# Patient Record
Sex: Female | Born: 1970 | Race: White | Hispanic: No | Marital: Married | Smoking: Never smoker
Health system: Southern US, Community
[De-identification: ages and names within clinical notes are randomized; demographics above are authoritative.]

## PROBLEM LIST (undated history)

## (undated) DIAGNOSIS — T4145XA Adverse effect of unspecified anesthetic, initial encounter: Secondary | ICD-10-CM

## (undated) DIAGNOSIS — M1990 Unspecified osteoarthritis, unspecified site: Secondary | ICD-10-CM

## (undated) DIAGNOSIS — T8859XA Other complications of anesthesia, initial encounter: Secondary | ICD-10-CM

## (undated) DIAGNOSIS — F419 Anxiety disorder, unspecified: Secondary | ICD-10-CM

## (undated) DIAGNOSIS — Z973 Presence of spectacles and contact lenses: Secondary | ICD-10-CM

## (undated) DIAGNOSIS — Z8489 Family history of other specified conditions: Secondary | ICD-10-CM

## (undated) DIAGNOSIS — Z8709 Personal history of other diseases of the respiratory system: Secondary | ICD-10-CM

## (undated) DIAGNOSIS — C801 Malignant (primary) neoplasm, unspecified: Secondary | ICD-10-CM

## (undated) HISTORY — PX: APPENDECTOMY: SHX54

## (undated) HISTORY — PX: DIAGNOSTIC LAPAROSCOPY: SUR761

## (undated) HISTORY — PX: OTHER SURGICAL HISTORY: SHX169

## (undated) HISTORY — PX: CHOLECYSTECTOMY: SHX55

## (undated) HISTORY — PX: ABDOMINAL HYSTERECTOMY: SHX81

---

## 2004-06-19 ENCOUNTER — Encounter: Admission: RE | Admit: 2004-06-19 | Discharge: 2004-06-19 | Payer: Self-pay | Admitting: Specialist

## 2004-06-26 ENCOUNTER — Encounter: Admission: RE | Admit: 2004-06-26 | Discharge: 2004-06-26 | Payer: Self-pay | Admitting: Orthopedic Surgery

## 2015-06-30 NOTE — H&P (Signed)
TOTAL HIP ADMISSION H&P  Patient is admitted for left total hip arthroplasty, anterior approach  (CERAMIC-ON-CERAMIC)  Subjective:  Chief Complaint:  Left hip primary OA / pain  HPI: Mary Padilla, 45 y.o. female, has a history of pain and functional disability in the left hip(s) due to arthritis and patient has failed non-surgical conservative treatments for greater than 12 weeks to include NSAID's and/or analgesics, corticosteriod injections and activity modification.  Onset of symptoms was gradual starting 2+ years ago with gradually worsening course since that time.The patient noted no past surgery on the left hip(s).  Patient currently rates pain in the left hip at 10 out of 10 with activity. Patient has night pain, worsening of pain with activity and weight bearing, trendelenberg gait, pain that interfers with activities of daily living and pain with passive range of motion. Patient has evidence of periarticular osteophytes and joint space narrowing by imaging studies. This condition presents safety issues increasing the risk of falls.  There is no current active infection.  Risks, benefits and expectations were discussed with the patient.  Risks including but not limited to the risk of anesthesia, blood clots, nerve damage, blood vessel damage, failure of the prosthesis, infection and up to and including death.  Patient understand the risks, benefits and expectations and wishes to proceed with surgery.   PCP: Mateo Flow, MD  D/C Plans:      Home with HHPT  Post-op Meds:       No Rx given  Tranexamic Acid:      To be given - IV   Decadron:      Is to be given  FYI:     ASA  Norco     Past Medical History  Diagnosis Date  . Complication of anesthesia     difficult to awaken   . Family history of adverse reaction to anesthesia     pts mother has allergy to sodium pentathol  . Wears glasses   . History of bronchitis   . Anxiety   . Arthritis   . Cancer [facility])     melanoma right  breast     Past Surgical History  Procedure Laterality Date  . Abdominal hysterectomy    . Melanoma of right breast    . Appendectomy    . Cholecystectomy    . Diagnostic laparoscopy      times 3  . Arthroscopy of right knee      times 3  . Arthroscopy of right shoulder       times 4    No prescriptions prior to admission   Allergies  Allergen Reactions  . Banana Anaphylaxis  . Ceclor [Cefaclor] Swelling  . Kiwi Extract Swelling and Other (See Comments)    Swelling on lips  . Latex Swelling and Other (See Comments)    Swelling of mouth, then throat and then goes down throat.    Social History  Substance Use Topics  . Smoking status: Never Smoker   . Smokeless tobacco: Never Used  . Alcohol Use: Yes     Comment: occas       Review of Systems  Constitutional: Negative.   HENT: Negative.   Eyes: Negative.   Respiratory: Negative.   Cardiovascular: Negative.   Gastrointestinal: Negative.   Genitourinary: Negative.   Musculoskeletal: Positive for joint pain.  Skin: Negative.   Neurological: Negative.   Endo/Heme/Allergies: Positive for environmental allergies.  Psychiatric/Behavioral: The patient is nervous/anxious.     Objective:  Physical Exam  Constitutional: She is oriented to person, place, and time. She appears well-developed.  HENT:  Head: Normocephalic.  Eyes: Pupils are equal, round, and reactive to light.  Neck: Neck supple. No JVD present. No tracheal deviation present. No thyromegaly present.  Cardiovascular: Normal rate, regular rhythm, normal heart sounds and intact distal pulses.   Respiratory: Effort normal and breath sounds normal. No stridor. No respiratory distress. She has no wheezes.  GI: Soft. There is no tenderness. There is no guarding.  Musculoskeletal:       Left hip: She exhibits decreased range of motion, decreased strength, tenderness and bony tenderness. She exhibits no swelling, no deformity and no laceration.  Lymphadenopathy:     She has no cervical adenopathy.  Neurological: She is alert and oriented to person, place, and time.  Skin: Skin is warm and dry.  Psychiatric: She has a normal mood and affect.      Imaging Review Plain radiographs demonstrate severe degenerative joint disease of the left hip(s). The bone quality appears to be good for age and reported activity level.  Assessment/Plan:  End stage arthritis, left hip(s)  The patient history, physical examination, clinical judgement of the provider and imaging studies are consistent with end stage degenerative joint disease of the left hip(s) and total hip arthroplasty is deemed medically necessary. The treatment options including medical management, injection therapy, arthroscopy and arthroplasty were discussed at length. The risks and benefits of total hip arthroplasty were presented and reviewed. The risks due to aseptic loosening, infection, stiffness, dislocation/subluxation,  thromboembolic complications and other imponderables were discussed.  The patient acknowledged the explanation, agreed to proceed with the plan and consent was signed. Patient is being admitted for inpatient treatment for surgery, pain control, PT, OT, prophylactic antibiotics, VTE prophylaxis, progressive ambulation and ADL's and discharge planning.The patient is planning to be discharged home with home health services.     West Pugh Leesha Veno   PA-C  07/07/2015, 8:15 AM

## 2015-07-05 ENCOUNTER — Encounter (HOSPITAL_COMMUNITY)
Admission: RE | Admit: 2015-07-05 | Discharge: 2015-07-05 | Disposition: A | Discharge status: Home / Self Care | Payer: BC Managed Care – PPO | Source: Ambulatory Visit | Attending: Orthopedic Surgery | Admitting: Orthopedic Surgery

## 2015-07-05 ENCOUNTER — Encounter (HOSPITAL_COMMUNITY): Payer: Self-pay

## 2015-07-05 DIAGNOSIS — M1612 Unilateral primary osteoarthritis, left hip: Secondary | ICD-10-CM | POA: Diagnosis not present

## 2015-07-05 DIAGNOSIS — M25552 Pain in left hip: Secondary | ICD-10-CM | POA: Insufficient documentation

## 2015-07-05 DIAGNOSIS — Z01812 Encounter for preprocedural laboratory examination: Secondary | ICD-10-CM | POA: Diagnosis present

## 2015-07-05 HISTORY — DX: Family history of other specified conditions: Z84.89

## 2015-07-05 HISTORY — DX: Unspecified osteoarthritis, unspecified site: M19.90

## 2015-07-05 HISTORY — DX: Other complications of anesthesia, initial encounter: T88.59XA

## 2015-07-05 HISTORY — DX: Adverse effect of unspecified anesthetic, initial encounter: T41.45XA

## 2015-07-05 HISTORY — DX: Personal history of other diseases of the respiratory system: Z87.09

## 2015-07-05 HISTORY — DX: Anxiety disorder, unspecified: F41.9

## 2015-07-05 HISTORY — DX: Presence of spectacles and contact lenses: Z97.3

## 2015-07-05 HISTORY — DX: Malignant (primary) neoplasm, unspecified: C80.1

## 2015-07-05 LAB — CBC
HCT: 40 % (ref 36.0–46.0)
HEMOGLOBIN: 13.3 g/dL (ref 12.0–15.0)
MCH: 30.1 pg (ref 26.0–34.0)
MCHC: 33.3 g/dL (ref 30.0–36.0)
MCV: 90.5 fL (ref 78.0–100.0)
Platelets: 261 10*3/uL (ref 150–400)
RBC: 4.42 MIL/uL (ref 3.87–5.11)
RDW: 12.8 % (ref 11.5–15.5)
WBC: 7.9 10*3/uL (ref 4.0–10.5)

## 2015-07-05 LAB — SURGICAL PCR SCREEN
MRSA, PCR: NEGATIVE
Staphylococcus aureus: NEGATIVE

## 2015-07-05 LAB — ABO/RH: ABO/RH(D): A POS

## 2015-07-05 NOTE — Progress Notes (Signed)
Clearance note per chart per Dr Humphrey Rolls 05/05/2015

## 2015-07-05 NOTE — Patient Instructions (Signed)
Mary Padilla  07/05/2015   Your procedure is scheduled on: Tuesday July 12, 2015   Report to [facility]  Entrance take [facility]  elevators to 3rd floor to  [place] at 6:00 AM.  Call this number if you have problems the morning of surgery [phone]   Remember: ONLY 1 PERSON MAY GO WITH YOU TO SHORT STAY TO GET  READY MORNING OF [place].  Do not eat food or drink liquids :After Midnight.     Take these medicines the morning of surgery with A SIP OF WATER: Venlafaxine (Effexor)                               You may not have any metal on your body including hair pins and              piercings  Do not wear jewelry, make-up, lotions, powders or perfumes, deodorant             Do not wear nail polish.  Do not shave  48 hours prior to surgery.                Do not bring valuables to the hospital. [place].  Contacts, dentures or bridgework may not be worn into surgery.  Leave suitcase in the car. After surgery it may be brought to your room.                Please read over the following fact sheets you were given: MRSA INFORMATION SHEET; INCENTIVE SPIROMETER; BLOOD TRANSFUSION INFORMATION SHEET  _____________________________________________________________________             [facility] - Preparing for Surgery Before surgery, you can play an important role.  Because skin is not sterile, your skin needs to be as free of germs as possible.  You can reduce the number of germs on your skin by washing with CHG (chlorahexidine gluconate) soap before surgery.  CHG is an antiseptic cleaner which kills germs and bonds with the skin to continue killing germs even after washing. Please DO NOT use if you have an allergy to CHG or antibacterial soaps.  If your skin becomes reddened/irritated stop using the CHG and inform your nurse when you arrive at Short Stay. Do not shave (including legs and underarms) for  at least 48 hours prior to the first CHG shower.  You may shave your face/neck. Please follow these instructions carefully:  1.  Shower with CHG Soap the night before surgery and the  morning of Surgery.  2.  If you choose to wash your hair, wash your hair first as usual with your  normal  shampoo.  3.  After you shampoo, rinse your hair and body thoroughly to remove the  shampoo.                           4.  Use CHG as you would any other liquid soap.  You can apply chg directly  to the skin and wash                       Gently with a scrungie or clean washcloth.  5.  Apply the CHG Soap to your body ONLY FROM THE NECK DOWN.   Do not use on face/ open                           Wound or open sores. Avoid contact with eyes, ears mouth and genitals (private parts).                       Wash face,  Genitals (private parts) with your normal soap.             6.  Wash thoroughly, paying special attention to the area where your surgery  will be performed.  7.  Thoroughly rinse your body with warm water from the neck down.  8.  DO NOT shower/wash with your normal soap after using and rinsing off  the CHG Soap.                9.  Pat yourself dry with a clean towel.            10.  Wear clean pajamas.            11.  Place clean sheets on your bed the night of your first shower and do not  sleep with pets. Day of Surgery : Do not apply any lotions/deodorants the morning of surgery.  Please wear clean clothes to the hospital/surgery center.  FAILURE TO FOLLOW THESE INSTRUCTIONS MAY RESULT IN THE CANCELLATION OF YOUR SURGERY PATIENT SIGNATURE_________________________________  NURSE SIGNATURE__________________________________  ________________________________________________________________________   Adam Phenix  An incentive spirometer is a tool that can help keep your lungs clear and active. This tool measures how well you are filling your lungs with each breath. Taking long deep  breaths may help reverse or decrease the chance of developing breathing (pulmonary) problems (especially infection) following:  A long period of time when you are unable to move or be active. BEFORE THE PROCEDURE   If the spirometer includes an indicator to show your best effort, your nurse or respiratory therapist will set it to a desired goal.  If possible, sit up straight or lean slightly forward. Try not to slouch.  Hold the incentive spirometer in an upright position. INSTRUCTIONS FOR USE   Sit on the edge of your bed if possible, or sit up as far as you can in bed or on a chair.  Hold the incentive spirometer in an upright position.  Breathe out normally.  Place the mouthpiece in your mouth and seal your lips tightly around it.  Breathe in slowly and as deeply as possible, raising the piston or the ball toward the top of the column.  Hold your breath for 3-5 seconds or for as long as possible. Allow the piston or ball to fall to the bottom of the column.  Remove the mouthpiece from your mouth and breathe out normally.  Rest for a few seconds and repeat Steps 1 through 7 at least 10 times every 1-2 hours when you are awake. Take your time and take a few normal breaths between deep breaths.  The spirometer may include an indicator to show your best effort. Use the indicator as a goal to work toward during each repetition.  After each set of 10 deep breaths, practice coughing to be sure your lungs are clear. If you have an incision (the cut made at the time of surgery), support your incision when coughing by placing a pillow or  rolled up towels firmly against it. Once you are able to get out of bed, walk around indoors and cough well. You may stop using the incentive spirometer when instructed by your caregiver.  RISKS AND COMPLICATIONS  Take your time so you do not get dizzy or light-headed.  If you are in pain, you may need to take or ask for pain medication before doing  incentive spirometry. It is harder to take a deep breath if you are having pain. AFTER USE  Rest and breathe slowly and easily.  It can be helpful to keep track of a log of your progress. Your caregiver can provide you with a simple table to help with this. If you are using the spirometer at home, follow these instructions: [place] IF:   You are having difficultly using the spirometer.  You have trouble using the spirometer as often as instructed.  Your pain medication is not giving enough relief while using the spirometer.  You develop fever of 100.5 F (38.1 C) or higher. SEEK IMMEDIATE MEDICAL CARE IF:   You cough up bloody sputum that had not been present before.  You develop fever of 102 F (38.9 C) or greater.  You develop worsening pain at or near the incision site. MAKE SURE YOU:   Understand these instructions.  Will watch your condition.  Will get help right away if you are not doing well or get worse. Document Released: 06/04/2006 Document Revised: 04/16/2011 Document Reviewed: 08/05/2006 ExitCare Patient Information 2014 Exit[employer], [place].   ________________________________________________________________________  WHAT IS A BLOOD TRANSFUSION? Blood Transfusion Information  A transfusion is the replacement of blood or some of its parts. Blood is made up of multiple cells which provide different functions.  Red blood cells carry oxygen and are used for blood loss replacement.  White blood cells fight against infection.  Platelets control bleeding.  Plasma helps clot blood.  Other blood products are available for specialized needs, such as hemophilia or other clotting disorders. BEFORE THE TRANSFUSION  Who gives blood for transfusions?   Healthy volunteers who are fully evaluated to make sure their blood is safe. This is blood bank blood. Transfusion therapy is the safest it has ever been in the practice of medicine. Before blood is taken from a  donor, a complete history is taken to make sure that person has no history of diseases nor engages in risky social behavior (examples are intravenous drug use or sexual activity with multiple partners). The donor's travel history is screened to minimize risk of transmitting infections, such as malaria. The donated blood is tested for signs of infectious diseases, such as HIV and hepatitis. The blood is then tested to be sure it is compatible with you in order to minimize the chance of a transfusion reaction. If you or a relative donates blood, this is often done in anticipation of surgery and is not appropriate for emergency situations. It takes many days to process the donated blood. RISKS AND COMPLICATIONS Although transfusion therapy is very safe and saves many lives, the main dangers of transfusion include:   Getting an infectious disease.  Developing a transfusion reaction. This is an allergic reaction to something in the blood you were given. Every precaution is taken to prevent this. The decision to have a blood transfusion has been considered carefully by your caregiver before blood is given. Blood is not given unless the benefits outweigh the risks. AFTER THE TRANSFUSION  Right after receiving a blood transfusion, you will usually  feel much better and more energetic. This is especially true if your red blood cells have gotten low (anemic). The transfusion raises the level of the red blood cells which carry oxygen, and this usually causes an energy increase.  The nurse administering the transfusion will monitor you carefully for complications. HOME CARE INSTRUCTIONS  No special instructions are needed after a transfusion. You may find your energy is better. Speak with your caregiver about any limitations on activity for underlying diseases you may have. SEEK MEDICAL CARE IF:   Your condition is not improving after your transfusion.  You develop redness or irritation at the intravenous (IV)  site. SEEK IMMEDIATE MEDICAL CARE IF:  Any of the following symptoms occur over the next 12 hours:  Shaking chills.  You have a temperature by mouth above 102 F (38.9 C), not controlled by medicine.  Chest, back, or muscle pain.  People around you feel you are not acting correctly or are confused.  Shortness of breath or difficulty breathing.  Dizziness and fainting.  You get a rash or develop hives.  You have a decrease in urine output.  Your urine turns a dark color or changes to pink, red, or brown. Any of the following symptoms occur over the next 10 days:  You have a temperature by mouth above 102 F (38.9 C), not controlled by medicine.  Shortness of breath.  Weakness after normal activity.  The white part of the eye turns yellow (jaundice).  You have a decrease in the amount of urine or are urinating less often.  Your urine turns a dark color or changes to pink, red, or brown. Document Released: 01/20/2000 Document Revised: 04/16/2011 Document Reviewed: 09/08/2007 [facility] Patient Information 2014 [place], [place].  _______________________________________________________________________

## 2015-07-12 ENCOUNTER — Inpatient Hospital Stay (HOSPITAL_COMMUNITY): Payer: BC Managed Care – PPO

## 2015-07-12 ENCOUNTER — Encounter (HOSPITAL_COMMUNITY): Payer: Self-pay | Admitting: *Deleted

## 2015-07-12 ENCOUNTER — Inpatient Hospital Stay (HOSPITAL_COMMUNITY): Payer: BC Managed Care – PPO | Admitting: Certified Registered"

## 2015-07-12 ENCOUNTER — Inpatient Hospital Stay (HOSPITAL_COMMUNITY)
Admission: RE | Admit: 2015-07-12 | Discharge: 2015-07-13 | DRG: 470 | Disposition: A | Discharge status: Home Health | Payer: BC Managed Care – PPO | Source: Ambulatory Visit | Attending: Orthopedic Surgery | Admitting: Orthopedic Surgery

## 2015-07-12 ENCOUNTER — Encounter (HOSPITAL_COMMUNITY)
Admission: RE | Disposition: A | Discharge status: Home Health | Payer: Self-pay | Source: Ambulatory Visit | Attending: Orthopedic Surgery

## 2015-07-12 DIAGNOSIS — M1612 Unilateral primary osteoarthritis, left hip: Principal | ICD-10-CM | POA: Diagnosis present

## 2015-07-12 DIAGNOSIS — Z91040 Latex allergy status: Secondary | ICD-10-CM | POA: Diagnosis not present

## 2015-07-12 DIAGNOSIS — F419 Anxiety disorder, unspecified: Secondary | ICD-10-CM | POA: Diagnosis present

## 2015-07-12 DIAGNOSIS — Z6839 Body mass index (BMI) 39.0-39.9, adult: Secondary | ICD-10-CM | POA: Diagnosis not present

## 2015-07-12 DIAGNOSIS — M25552 Pain in left hip: Secondary | ICD-10-CM | POA: Diagnosis present

## 2015-07-12 DIAGNOSIS — E669 Obesity, unspecified: Secondary | ICD-10-CM | POA: Diagnosis present

## 2015-07-12 DIAGNOSIS — Z91018 Allergy to other foods: Secondary | ICD-10-CM

## 2015-07-12 DIAGNOSIS — Z881 Allergy status to other antibiotic agents status: Secondary | ICD-10-CM

## 2015-07-12 DIAGNOSIS — Z85820 Personal history of malignant melanoma of skin: Secondary | ICD-10-CM

## 2015-07-12 DIAGNOSIS — Z96649 Presence of unspecified artificial hip joint: Secondary | ICD-10-CM

## 2015-07-12 HISTORY — PX: TOTAL HIP ARTHROPLASTY: SHX124

## 2015-07-12 LAB — TYPE AND SCREEN
ABO/RH(D): A POS
Antibody Screen: NEGATIVE

## 2015-07-12 SURGERY — ARTHROPLASTY, HIP, TOTAL, ANTERIOR APPROACH
Anesthesia: Spinal | Site: Hip | Laterality: Left

## 2015-07-12 MED ORDER — PHENOL 1.4 % MT LIQD
1.0000 | OROMUCOSAL | Status: DC | PRN
  Filled 2015-07-12: qty 177

## 2015-07-12 MED ORDER — ALUM & MAG HYDROXIDE-SIMETH 200-200-20 MG/5ML PO SUSP: 30.0000 mL | ORAL | Status: DC | PRN

## 2015-07-12 MED ORDER — MIDAZOLAM HCL 5 MG/5ML IJ SOLN
INTRAMUSCULAR | Status: DC | PRN
  Administered 2015-07-12: 2 mg via INTRAVENOUS

## 2015-07-12 MED ORDER — DEXAMETHASONE SODIUM PHOSPHATE 10 MG/ML IJ SOLN
10.0000 mg | Freq: Once | INTRAMUSCULAR | Status: AC
  Administered 2015-07-13: 10 mg via INTRAVENOUS
  Filled 2015-07-12: qty 1

## 2015-07-12 MED ORDER — PROPOFOL 10 MG/ML IV BOLUS
INTRAVENOUS | Status: AC
  Filled 2015-07-12: qty 20

## 2015-07-12 MED ORDER — METOCLOPRAMIDE HCL 5 MG PO TABS: 5.0000 mg | ORAL_TABLET | Freq: Three times a day (TID) | ORAL | Status: DC | PRN

## 2015-07-12 MED ORDER — DEXAMETHASONE SODIUM PHOSPHATE 10 MG/ML IJ SOLN
INTRAMUSCULAR | Status: AC
  Filled 2015-07-12: qty 1

## 2015-07-12 MED ORDER — DIPHENHYDRAMINE HCL 25 MG PO CAPS: 25.0000 mg | ORAL_CAPSULE | Freq: Four times a day (QID) | ORAL | Status: DC | PRN

## 2015-07-12 MED ORDER — LIDOCAINE 2% (20 MG/ML) 5 ML SYRINGE
INTRAMUSCULAR | Status: DC | PRN
  Administered 2015-07-12: 20 mg via INTRAVENOUS

## 2015-07-12 MED ORDER — FERROUS SULFATE 325 (65 FE) MG PO TABS
325.0000 mg | ORAL_TABLET | Freq: Three times a day (TID) | ORAL | Status: DC
  Administered 2015-07-12 – 2015-07-13 (×3): 325 mg via ORAL
  Filled 2015-07-12 (×3): qty 1

## 2015-07-12 MED ORDER — METHOCARBAMOL 500 MG PO TABS
500.0000 mg | ORAL_TABLET | Freq: Four times a day (QID) | ORAL | Status: DC | PRN
  Administered 2015-07-12 – 2015-07-13 (×3): 500 mg via ORAL
  Filled 2015-07-12 (×3): qty 1

## 2015-07-12 MED ORDER — ESTROGENS CONJUGATED 0.9 MG PO TABS
0.9000 mg | ORAL_TABLET | Freq: Every day | ORAL | Status: DC
  Administered 2015-07-12 – 2015-07-13 (×2): 0.9 mg via ORAL
  Filled 2015-07-12 (×2): qty 1

## 2015-07-12 MED ORDER — METOCLOPRAMIDE HCL 5 MG/ML IJ SOLN: 5.0000 mg | Freq: Three times a day (TID) | INTRAMUSCULAR | Status: DC | PRN

## 2015-07-12 MED ORDER — ASPIRIN EC 325 MG PO TBEC
325.0000 mg | DELAYED_RELEASE_TABLET | Freq: Two times a day (BID) | ORAL | Status: DC
  Administered 2015-07-13: 325 mg via ORAL
  Filled 2015-07-12: qty 1

## 2015-07-12 MED ORDER — HYDROMORPHONE HCL 1 MG/ML IJ SOLN
0.2500 mg | INTRAMUSCULAR | Status: DC | PRN
  Administered 2015-07-12 (×4): 0.25 mg via INTRAVENOUS

## 2015-07-12 MED ORDER — DEXAMETHASONE SODIUM PHOSPHATE 10 MG/ML IJ SOLN
10.0000 mg | Freq: Once | INTRAMUSCULAR | Status: AC
  Administered 2015-07-12: 10 mg via INTRAVENOUS

## 2015-07-12 MED ORDER — MENTHOL 3 MG MT LOZG: 1.0000 | LOZENGE | OROMUCOSAL | Status: DC | PRN

## 2015-07-12 MED ORDER — PROMETHAZINE HCL 25 MG/ML IJ SOLN: 6.2500 mg | INTRAMUSCULAR | Status: DC | PRN

## 2015-07-12 MED ORDER — FENTANYL CITRATE (PF) 100 MCG/2ML IJ SOLN
INTRAMUSCULAR | Status: DC | PRN
  Administered 2015-07-12 (×2): 50 ug via INTRAVENOUS

## 2015-07-12 MED ORDER — VANCOMYCIN HCL IN DEXTROSE 1-5 GM/200ML-% IV SOLN
1000.0000 mg | Freq: Two times a day (BID) | INTRAVENOUS | Status: AC
  Administered 2015-07-12: 1000 mg via INTRAVENOUS
  Filled 2015-07-12: qty 200

## 2015-07-12 MED ORDER — ONDANSETRON HCL 4 MG/2ML IJ SOLN
INTRAMUSCULAR | Status: AC
  Filled 2015-07-12: qty 2

## 2015-07-12 MED ORDER — BUPIVACAINE HCL (PF) 0.5 % IJ SOLN
INTRAMUSCULAR | Status: AC
  Filled 2015-07-12: qty 30

## 2015-07-12 MED ORDER — PROPOFOL 10 MG/ML IV BOLUS
INTRAVENOUS | Status: DC | PRN
  Administered 2015-07-12: 20 mg via INTRAVENOUS

## 2015-07-12 MED ORDER — TRANEXAMIC ACID 1000 MG/10ML IV SOLN
1000.0000 mg | Freq: Once | INTRAVENOUS | Status: AC
  Administered 2015-07-12: 1000 mg via INTRAVENOUS
  Filled 2015-07-12: qty 10

## 2015-07-12 MED ORDER — MAGNESIUM CITRATE PO SOLN: 1.0000 | Freq: Once | ORAL | Status: DC | PRN

## 2015-07-12 MED ORDER — PROPOFOL 10 MG/ML IV BOLUS
INTRAVENOUS | Status: AC
  Filled 2015-07-12: qty 40

## 2015-07-12 MED ORDER — ONDANSETRON HCL 4 MG/2ML IJ SOLN
INTRAMUSCULAR | Status: DC | PRN
  Administered 2015-07-12: 4 mg via INTRAVENOUS

## 2015-07-12 MED ORDER — HYDROCODONE-ACETAMINOPHEN 7.5-325 MG PO TABS
1.0000 | ORAL_TABLET | ORAL | Status: DC
  Administered 2015-07-12: 2 via ORAL
  Administered 2015-07-12: 1 via ORAL
  Administered 2015-07-12: 2 via ORAL
  Administered 2015-07-12: 1 via ORAL
  Administered 2015-07-13 (×3): 2 via ORAL
  Filled 2015-07-12 (×7): qty 2

## 2015-07-12 MED ORDER — HYDROMORPHONE HCL 1 MG/ML IJ SOLN
INTRAMUSCULAR | Status: AC
  Filled 2015-07-12: qty 1

## 2015-07-12 MED ORDER — HYDROMORPHONE HCL 1 MG/ML IJ SOLN
0.5000 mg | INTRAMUSCULAR | Status: DC | PRN
  Administered 2015-07-12 – 2015-07-13 (×3): 1 mg via INTRAVENOUS
  Filled 2015-07-12 (×3): qty 1

## 2015-07-12 MED ORDER — MIDAZOLAM HCL 2 MG/2ML IJ SOLN
INTRAMUSCULAR | Status: AC
  Filled 2015-07-12: qty 2

## 2015-07-12 MED ORDER — SODIUM CHLORIDE 0.9 % IV SOLN
100.0000 mL/h | INTRAVENOUS | Status: DC
  Administered 2015-07-12 – 2015-07-13 (×2): 100 mL/h via INTRAVENOUS
  Filled 2015-07-12 (×4): qty 1000

## 2015-07-12 MED ORDER — DOCUSATE SODIUM 100 MG PO CAPS
100.0000 mg | ORAL_CAPSULE | Freq: Two times a day (BID) | ORAL | Status: DC
  Administered 2015-07-12 – 2015-07-13 (×2): 100 mg via ORAL
  Filled 2015-07-12 (×2): qty 1

## 2015-07-12 MED ORDER — BISACODYL 10 MG RE SUPP: 10.0000 mg | Freq: Every day | RECTAL | Status: DC | PRN

## 2015-07-12 MED ORDER — PROPOFOL 500 MG/50ML IV EMUL
INTRAVENOUS | Status: DC | PRN
  Administered 2015-07-12: 50 ug/kg/min via INTRAVENOUS

## 2015-07-12 MED ORDER — ONDANSETRON HCL 4 MG/2ML IJ SOLN: 4.0000 mg | Freq: Four times a day (QID) | INTRAMUSCULAR | Status: DC | PRN

## 2015-07-12 MED ORDER — LACTATED RINGERS IV SOLN
INTRAVENOUS | Status: DC
Start: 2015-07-12 — End: 2015-07-13
  Administered 2015-07-12 (×2): via INTRAVENOUS

## 2015-07-12 MED ORDER — CEFAZOLIN SODIUM-DEXTROSE 2-4 GM/100ML-% IV SOLN: 2.0000 g | INTRAVENOUS | Status: DC

## 2015-07-12 MED ORDER — BUPIVACAINE IN DEXTROSE 0.75-8.25 % IT SOLN
INTRATHECAL | Status: DC | PRN
  Administered 2015-07-12: 1.8 mL via INTRATHECAL

## 2015-07-12 MED ORDER — SODIUM CHLORIDE 0.9 % IR SOLN
Status: DC | PRN
  Administered 2015-07-12: 1000 mL

## 2015-07-12 MED ORDER — CHLORHEXIDINE GLUCONATE 4 % EX LIQD: 60.0000 mL | Freq: Once | CUTANEOUS | Status: DC

## 2015-07-12 MED ORDER — FENTANYL CITRATE (PF) 100 MCG/2ML IJ SOLN
INTRAMUSCULAR | Status: AC
  Filled 2015-07-12: qty 2

## 2015-07-12 MED ORDER — DEXTROSE 5 % IV SOLN
500.0000 mg | Freq: Four times a day (QID) | INTRAVENOUS | Status: DC | PRN
  Administered 2015-07-12: 500 mg via INTRAVENOUS
  Filled 2015-07-12: qty 5
  Filled 2015-07-12: qty 550

## 2015-07-12 MED ORDER — ONDANSETRON HCL 4 MG PO TABS: 4.0000 mg | ORAL_TABLET | Freq: Four times a day (QID) | ORAL | Status: DC | PRN

## 2015-07-12 MED ORDER — VENLAFAXINE HCL ER 150 MG PO CP24
150.0000 mg | ORAL_CAPSULE | Freq: Every morning | ORAL | Status: DC
  Administered 2015-07-13: 150 mg via ORAL
  Filled 2015-07-12: qty 1

## 2015-07-12 MED ORDER — POLYETHYLENE GLYCOL 3350 17 G PO PACK
17.0000 g | PACK | Freq: Two times a day (BID) | ORAL | Status: DC
Start: 2015-07-12 — End: 2015-07-13
  Administered 2015-07-12 – 2015-07-13 (×2): 17 g via ORAL
  Filled 2015-07-12 (×2): qty 1

## 2015-07-12 MED ORDER — CELECOXIB 200 MG PO CAPS
200.0000 mg | ORAL_CAPSULE | Freq: Two times a day (BID) | ORAL | Status: DC
Start: 2015-07-12 — End: 2015-07-13
  Administered 2015-07-12 – 2015-07-13 (×2): 200 mg via ORAL
  Filled 2015-07-12 (×2): qty 1

## 2015-07-12 MED ORDER — LIDOCAINE HCL (CARDIAC) 20 MG/ML IV SOLN
INTRAVENOUS | Status: AC
  Filled 2015-07-12: qty 5

## 2015-07-12 MED ORDER — VANCOMYCIN HCL 10 G IV SOLR
1500.0000 mg | Freq: Once | INTRAVENOUS | Status: AC
  Administered 2015-07-12: 1500 mg via INTRAVENOUS
  Filled 2015-07-12: qty 1500

## 2015-07-12 SURGICAL SUPPLY — 37 items
BAG DECANTER FOR FLEXI CONT (MISCELLANEOUS) IMPLANT
BAG SPEC THK2 15X12 ZIP CLS (MISCELLANEOUS)
BAG ZIPLOCK 12X15 (MISCELLANEOUS) IMPLANT
CAPT HIP TOTAL 3 ×1 IMPLANT
CLOTH BEACON ORANGE TIMEOUT ST (SAFETY) ×2 IMPLANT
COVER PERINEAL POST (MISCELLANEOUS) ×2 IMPLANT
DRAPE STERI IOBAN 125X83 (DRAPES) ×2 IMPLANT
DRAPE U-SHAPE 47X51 STRL (DRAPES) ×4 IMPLANT
DRESSING AQUACEL AG SP 3.5X10 (GAUZE/BANDAGES/DRESSINGS) ×1 IMPLANT
DRSG AQUACEL AG SP 3.5X10 (GAUZE/BANDAGES/DRESSINGS) ×2
DURAPREP 26ML APPLICATOR (WOUND CARE) ×2 IMPLANT
ELECT REM PT RETURN 15FT ADLT (MISCELLANEOUS) IMPLANT
ELECT REM PT RETURN 9FT ADLT (ELECTROSURGICAL) ×2
ELECTRODE REM PT RTRN 9FT ADLT (ELECTROSURGICAL) ×1 IMPLANT
GLOVE BIOGEL M STRL SZ7.5 (GLOVE) IMPLANT
GLOVE BIOGEL PI IND STRL 7.5 (GLOVE) ×1 IMPLANT
GLOVE BIOGEL PI IND STRL 8.5 (GLOVE) ×1 IMPLANT
GLOVE BIOGEL PI INDICATOR 7.5 (GLOVE) ×1
GLOVE BIOGEL PI INDICATOR 8.5 (GLOVE) ×9
GLOVE ECLIPSE 8.0 STRL XLNG CF (GLOVE) IMPLANT
GLOVE ORTHO TXT STRL SZ7.5 (GLOVE) ×1 IMPLANT
GOWN STRL REUS W/TWL LRG LVL3 (GOWN DISPOSABLE) IMPLANT
GOWN STRL REUS W/TWL XL LVL3 (GOWN DISPOSABLE) ×2 IMPLANT
HOLDER FOLEY CATH W/STRAP (MISCELLANEOUS) ×2 IMPLANT
LIQUID BAND (GAUZE/BANDAGES/DRESSINGS) ×2 IMPLANT
PACK ANTERIOR HIP CUSTOM (KITS) ×2 IMPLANT
SAW OSC TIP CART 19.5X105X1.3 (SAW) ×2 IMPLANT
SUT MNCRL AB 4-0 PS2 18 (SUTURE) ×2 IMPLANT
SUT VIC AB 1 CT1 36 (SUTURE) ×6 IMPLANT
SUT VIC AB 2-0 CT1 27 (SUTURE) ×4
SUT VIC AB 2-0 CT1 TAPERPNT 27 (SUTURE) ×2 IMPLANT
SUT VLOC 180 0 24IN GS25 (SUTURE) ×2 IMPLANT
TRAY FOLEY METER SIL LF 16FR (CATHETERS) ×1 IMPLANT
TRAY FOLEY W/METER SILVER 14FR (SET/KITS/TRAYS/PACK) IMPLANT
TRAY FOLEY W/METER SILVER 16FR (SET/KITS/TRAYS/PACK) IMPLANT
WATER STERILE IRR 1500ML POUR (IV SOLUTION) ×2 IMPLANT
YANKAUER SUCT BULB TIP 10FT TU (MISCELLANEOUS) IMPLANT

## 2015-07-12 NOTE — Interval H&P Note (Signed)
History and Physical Interval Note:  07/12/2015 7:05 AM  Mary Padilla  has presented today for surgery, with the diagnosis of left hip osteoarthritis  The various methods of treatment have been discussed with the patient and family. After consideration of risks, benefits and other options for treatment, the patient has consented to  Procedure(s): LEFT TOTAL HIP ARTHROPLASTY ANTERIOR APPROACH (Left) as a surgical intervention .  The patient's history has been reviewed, patient examined, no change in status, stable for surgery.  I have reviewed the patient's chart and labs.  Questions were answered to the patient's satisfaction.     Mauri Pole

## 2015-07-12 NOTE — Anesthesia Postprocedure Evaluation (Signed)
Anesthesia Post Note  Patient: Mary Padilla  Procedure(s) Performed: Procedure(s) (LRB): LEFT TOTAL HIP ARTHROPLASTY ANTERIOR APPROACH (Left)  Patient location during evaluation: PACU Anesthesia Type: Spinal Level of consciousness: awake Pain management: pain level controlled Vital Signs Assessment: post-procedure vital signs reviewed and stable Respiratory status: spontaneous breathing Cardiovascular status: stable Anesthetic complications: no    Last Vitals:  Filed Vitals:   07/12/15 1145 07/12/15 1159  BP: 152/88 142/85  Pulse: 57 59  Temp:  36.7 C  Resp: 12 16    Last Pain:  Filed Vitals:   07/12/15 1200  PainSc: 6                  EDWARDS,Shaquala Broeker

## 2015-07-12 NOTE — Anesthesia Procedure Notes (Signed)
Spinal Patient location during procedure: OR Start time: 07/12/2015 8:36 AM End time: 07/12/2015 8:43 AM Staffing Anesthesiologist: Finis Bud Resident/CRNA: Lajuana Carry E Performed by: anesthesiologist  Preanesthetic Checklist Completed: patient identified, site marked, surgical consent, pre-op evaluation, timeout performed, IV checked, risks and benefits discussed and monitors and equipment checked Spinal Block Patient position: sitting Prep: Betadine Patient monitoring: heart rate, continuous pulse ox and blood pressure Approach: midline Location: L3-4 Injection technique: single-shot Needle Needle type: [place]  Needle gauge: 25 G Needle length: 9 cm Assessment Sensory level: T6 Additional Notes Time out performed, kit expiration checked. Initial SAB attempt by CRNA w/o success. Pt sedated and SAB by Dr. Oletta Lamas. Clear CSF, neg heme, neg paresthesia. Patient tol well.

## 2015-07-12 NOTE — Evaluation (Signed)
Physical Therapy Evaluation Patient Details Name: Mary Padilla MRN: [id number] DOB: Dec 27, 1970 Today's Date: 07/12/2015   History of Present Illness  [name]  Clinical Impression  The patient ambulate x 90 and tolerated  Very well. Plans Dc home. Pt admitted with above diagnosis. Pt currently with functional limitations due to the deficits listed below (see PT Problem List).  Pt will benefit from skilled PT to increase their independence and safety with mobility to allow discharge to the venue listed below.       Follow Up Recommendations Home health PT;Supervision/Assistance - 24 hour    Equipment Recommendations  None recommended by PT    Recommendations for Other Services       Precautions / Restrictions Precautions Precautions: Fall      Mobility  Bed Mobility Overal bed mobility: Needs Assistance Bed Mobility: Supine to Sit     Supine to sit: Min assist     General bed mobility comments: cues for technique   Transfers Overall transfer level: Needs assistance Equipment used: Rolling walker (2 wheeled) Transfers: Sit to/from Stand Sit to Stand: Min assist         General transfer comment: cues for ahnd and  left leg position  Ambulation/Gait Ambulation/Gait assistance: Min assist Ambulation Distance (Feet): 90 Feet Assistive device: Rolling walker (2 wheeled) Gait Pattern/deviations: Step-to pattern;Antalgic;Decreased stance time - left;Decreased step length - left     General Gait Details: cues for sequence and position inside the  RW  Stairs            Wheelchair Mobility    Modified Rankin (Stroke Patients Only)       Balance                                             Pertinent Vitals/Pain Pain Assessment: 0-10 Pain Score: 2  Pain Location: L hip Pain Descriptors / Indicators: Aching;Discomfort;Pressure Pain Intervention(s): Limited activity within patient's tolerance;Monitored during session;Premedicated before  session;Repositioned;Ice applied    Home Living Family/patient expects to be discharged to:: Private residence Living Arrangements: Spouse/significant other Available Help at Discharge: Family Type of Home: House Home Access: Stairs to enter Entrance Stairs-Rails: Psychiatric nurse of Steps: 4 Home Layout: One level Home Equipment: Environmental consultant - 2 wheels;Bedside commode      Prior Function Level of Independence: Independent               Hand Dominance        Extremity/Trunk Assessment   Upper Extremity Assessment: Defer to OT evaluation           Lower Extremity Assessment: LLE deficits/detail   LLE Deficits / Details: initially some difficulty with advancing the leg first, improved w/ distance.  Cervical / Trunk Assessment: Normal  Communication   Communication: No difficulties  Cognition Arousal/Alertness: Awake/alert Behavior During Therapy: WFL for tasks assessed/performed Overall Cognitive Status: Within Functional Limits for tasks assessed                      General Comments      Exercises Total Joint Exercises Heel Slides: AAROM;Left;10 reps;Supine      Assessment/Plan    PT Assessment Patient needs continued PT services  PT Diagnosis Difficulty walking;Acute pain   PT Problem List Decreased strength;Decreased range of motion;Decreased activity tolerance;Decreased mobility;Decreased knowledge of precautions;Decreased safety awareness;Decreased knowledge of use of DME;Pain  PT Treatment Interventions DME instruction;Gait training;Stair training;Therapeutic activities;Functional mobility training;Therapeutic exercise;Patient/family education   PT Goals (Current goals can be found in the Care Plan section) Acute Rehab PT Goals Patient Stated Goal: to go homw PT Goal Formulation: With patient Time For Goal Achievement: 07/15/15 Potential to Achieve Goals: Good    Frequency 7X/week   Barriers to discharge         Co-evaluation               End of Session   Activity Tolerance: Patient tolerated treatment well Patient left: in chair;with call bell/phone within reach Nurse Communication: Mobility status         Time: [id number] PT Time Calculation (min) (ACUTE ONLY): 17 min   Charges:   PT Evaluation $PT Eval Low Complexity: 1 Procedure     PT G CodesMarcelino Freestone PT [id number]  07/12/2015, 5:17 PM

## 2015-07-12 NOTE — Op Note (Signed)
NAME:  Mary Padilla                ACCOUNT NO.: [id number]      MEDICAL RECORD NO.: [id number]      FACILITY:  [facility]      PHYSICIAN:  Paralee Cancel D  DATE OF BIRTH:  Aug 03, 1970     DATE OF PROCEDURE:  07/12/2015                                 OPERATIVE REPORT         PREOPERATIVE DIAGNOSIS: Left  hip osteoarthritis.      POSTOPERATIVE DIAGNOSIS:  Left hip osteoarthritis.      PROCEDURE:  Left total hip replacement through an anterior approach   utilizing DePuy THR system, component size 62mm pinnacle cup, a size 36 neutral   Ceramax ceramic liner, a size 4 Hi Tri Lock stem with a 36+1.5  delta ceramic   ball.      SURGEON:  Pietro Cassis. Alvan Dame, M.D.      ASSISTANT:  Nehemiah Massed, PA-C     ANESTHESIA:  Spinal.      SPECIMENS:  None.      COMPLICATIONS:  None.      BLOOD LOSS:  350 cc     DRAINS:  None.      INDICATION OF THE PROCEDURE:  Mary Padilla is a 45 y.o. female who had   presented to office for evaluation of left hip pain.  Radiographs revealed   progressive degenerative changes with bone-on-bone   articulation to the  hip joint.  The patient had painful limited range of   motion significantly affecting their overall quality of life.  The patient was failing to    respond to conservative measures, and at this point was ready   to proceed with more definitive measures.  The patient has noted progressive   degenerative changes in his hip, progressive problems and dysfunction   with regarding the hip prior to surgery.  Consent was obtained for   benefit of pain relief.  Specific risk of infection, DVT, component   failure, dislocation, need for revision surgery, as well discussion of   the anterior versus posterior approach were reviewed.  Consent was   obtained for benefit of anterior pain relief through an anterior   approach.      PROCEDURE IN DETAIL:  The patient was brought to operative theater.   Once adequate anesthesia, preoperative  antibiotics, 2gm of Vancomycin, 1 gm of Tranexamic Acid, and 10 mg of Decdron administered.   The patient was positioned supine on the OSI Hanna table.  Once adequate   padding of boney process was carried out, we had predraped out the hip, and  used fluoroscopy to confirm orientation of the pelvis and position.      The left hip was then prepped and draped from proximal iliac crest to   mid thigh with shower curtain technique.      Time-out was performed identifying the patient, planned procedure, and   extremity.     An incision was then made 2 cm distal and lateral to the   anterior superior iliac spine extending over the orientation of the   tensor fascia lata muscle and sharp dissection was carried down to the   fascia of the muscle and protractor placed in the soft tissues.      The fascia was  then incised.  The muscle belly was identified and swept   laterally and retractor placed along the superior neck.  Following   cauterization of the circumflex vessels and removing some pericapsular   fat, a second cobra retractor was placed on the inferior neck.  A third   retractor was placed on the anterior acetabulum after elevating the   anterior rectus.  A L-capsulotomy was along the line of the   superior neck to the trochanteric fossa, then extended proximally and   distally.  Tag sutures were placed and the retractors were then placed   intracapsular.  We then identified the trochanteric fossa and   orientation of my neck cut, confirmed this radiographically   and then made a neck osteotomy with the femur on traction.  The femoral   head was removed without difficulty or complication.  Traction was let   off and retractors were placed posterior and anterior around the   acetabulum.      The labrum and foveal tissue were debrided.  I began reaming with a 34mm   reamer and reamed up to 59mm reamer with good bony bed preparation and a 86mm   cup was chosen.  The final 60mm Pinnacle  cup was then impacted under fluoroscopy  to confirm the depth of penetration and orientation with respect to   abduction.  A screw was placed followed by the hole eliminator.  The final   36 neutral Ceramax ceramic liner was impacted with good visualized rim fit.  The cup was positioned anatomically within the acetabular portion of the pelvis.      At this point, the femur was rolled at 80 degrees.  Further capsule was   released off the inferior aspect of the femoral neck.  I then   released the superior capsule proximally.  The hook was placed laterally   along the femur and elevated manually and held in position with the bed   hook.  The leg was then extended and adducted with the leg rolled to 100   degrees of external rotation.  Once the proximal femur was fully   exposed, I used a box osteotome to set orientation.  I then began   broaching with the starting chili pepper broach and passed this by hand and then broached up to 4.  With the 4 broach in place I chose a high offset neck and did several trial reductions.  The offset was appropriate, leg lengths   appeared to be equal best matched with the +1.5 head ball, confirmed radiographically.   Given these findings, I went ahead and dislocated the hip, repositioned all   retractors and positioned the right hip in the extended and abducted position.  The final 4 Hi Tri Lock stem was   chosen and it was impacted down to the level of neck cut.  Based on this   and the trial reduction, a 36+1.5 delta ceramic ball was chosen and   impacted onto a clean and dry trunnion, and the hip was reduced.  The   hip had been irrigated throughout the case again at this point.  I did   reapproximate the superior capsular leaflet to the anterior leaflet   using #1 Vicryl.  The fascia of the   tensor fascia lata muscle was then reapproximated using #1 Vicryl and #0 V-lock sutures.  The   remaining wound was closed with 2-0 Vicryl and running 4-0 Monocryl.    The hip was cleaned, dried,  and dressed sterilely using Dermabond and   Aquacel dressing.  She was then brought   to recovery room in stable condition tolerating the procedure well.    Nehemiah Massed, PA-C was present for the entirety of the case involved from   preoperative positioning, perioperative retractor management, general   facilitation of the case, as well as primary wound closure as assistant.            Pietro Cassis Alvan Dame, M.D.        07/12/2015 10:06 AM

## 2015-07-12 NOTE — Anesthesia Preprocedure Evaluation (Signed)
Anesthesia Evaluation  Patient identified by MRN, date of birth, ID band Patient awake    Reviewed: Allergy & Precautions, NPO status , Patient's Chart, lab work & pertinent test results  Airway Mallampati: II  TM Distance: >3 FB Neck ROM: Full    Dental   Pulmonary neg pulmonary ROS,    breath sounds clear to auscultation       Cardiovascular negative cardio ROS   Rhythm:Regular Rate:Normal     Neuro/Psych    GI/Hepatic negative GI ROS, Neg liver ROS,   Endo/Other  negative endocrine ROS  Renal/GU negative Renal ROS     Musculoskeletal   Abdominal   Peds  Hematology   Anesthesia Other Findings   Reproductive/Obstetrics                             Anesthesia Physical Anesthesia Plan  ASA: II  Anesthesia Plan: Spinal   Post-op Pain Management:    Induction: Intravenous  Airway Management Planned: Simple Face Mask  Additional Equipment:   Intra-op Plan:   Post-operative Plan:   Informed Consent: I have reviewed the patients History and Physical, chart, labs and discussed the procedure including the risks, benefits and alternatives for the proposed anesthesia with the patient or authorized representative who has indicated his/her understanding and acceptance.   Dental advisory given  Plan Discussed with: CRNA and Anesthesiologist  Anesthesia Plan Comments:         Anesthesia Quick Evaluation

## 2015-07-12 NOTE — Transfer of Care (Signed)
T 12 right level   Immediate Anesthesia Transfer of Care Note  Patient: Mary Padilla  Procedure(s) Performed: Procedure(s) (LRB): LEFT TOTAL HIP ARTHROPLASTY ANTERIOR APPROACH (Left)  Patient Location: PACU  Anesthesia Type: SAB  Level of Consciousness: awake, sedated, patient cooperative and responds to stimulation per SAB levels   Airway & Oxygen Therapy: Patient Spontanous Breathing and Patient connected to face mask oxygen  Post-op Assessment: Report given to PACU RN, Post -op Vital signs reviewed and stable and Patient unable to move legs   Post vital signs: Reviewed and stable  Complications: No apparent anesthesia complications

## 2015-07-13 DIAGNOSIS — E669 Obesity, unspecified: Secondary | ICD-10-CM | POA: Diagnosis present

## 2015-07-13 LAB — CBC
HCT: 35.4 % — ABNORMAL LOW (ref 36.0–46.0)
HEMOGLOBIN: 11.9 g/dL — AB (ref 12.0–15.0)
MCH: 29.8 pg (ref 26.0–34.0)
MCHC: 33.6 g/dL (ref 30.0–36.0)
MCV: 88.7 fL (ref 78.0–100.0)
PLATELETS: 221 10*3/uL (ref 150–400)
RBC: 3.99 MIL/uL (ref 3.87–5.11)
RDW: 12.3 % (ref 11.5–15.5)
WBC: 12.9 10*3/uL — ABNORMAL HIGH (ref 4.0–10.5)

## 2015-07-13 LAB — BASIC METABOLIC PANEL
Anion gap: 5 (ref 5–15)
BUN: 10 mg/dL (ref 6–20)
CHLORIDE: 105 mmol/L (ref 101–111)
CO2: 27 mmol/L (ref 22–32)
Calcium: 8.3 mg/dL — ABNORMAL LOW (ref 8.9–10.3)
Creatinine, Ser: 0.79 mg/dL (ref 0.44–1.00)
GFR calc non Af Amer: >60 mL/min (ref >60)
Glucose, Bld: 141 mg/dL — ABNORMAL HIGH (ref 65–99)
POTASSIUM: 4.4 mmol/L (ref 3.5–5.1)
SODIUM: 137 mmol/L (ref 135–145)

## 2015-07-13 MED ORDER — ASPIRIN 325 MG PO TBEC: 325.0000 mg | DELAYED_RELEASE_TABLET | Freq: Two times a day (BID) | ORAL | Status: AC

## 2015-07-13 MED ORDER — POLYETHYLENE GLYCOL 3350 17 G PO PACK: 17.0000 g | PACK | Freq: Two times a day (BID) | ORAL | Status: AC

## 2015-07-13 MED ORDER — FERROUS SULFATE 325 (65 FE) MG PO TABS: 325.0000 mg | ORAL_TABLET | Freq: Three times a day (TID) | ORAL | Status: AC

## 2015-07-13 MED ORDER — HYDROCODONE-ACETAMINOPHEN 7.5-325 MG PO TABS: 1.0000 | ORAL_TABLET | ORAL | Status: AC | PRN

## 2015-07-13 MED ORDER — DOCUSATE SODIUM 100 MG PO CAPS: 100.0000 mg | ORAL_CAPSULE | Freq: Two times a day (BID) | ORAL | Status: AC

## 2015-07-13 MED ORDER — METHOCARBAMOL 500 MG PO TABS: 500.0000 mg | ORAL_TABLET | Freq: Four times a day (QID) | ORAL | Status: AC | PRN

## 2015-07-13 NOTE — Progress Notes (Signed)
Physical Therapy Treatment Patient Details Name: Mary Padilla MRN: [id number] DOB: [birth year] Today's Date: 07/13/2015    History of Present Illness [name]    PT Comments    The patient practiced steps . Ready for DC.  Follow Up Recommendations  Home health PT;Supervision/Assistance - 24 hour     Equipment Recommendations  None recommended by PT    Recommendations for Other Services       Precautions / Restrictions Precautions Precautions: Fall    Mobility  Bed Mobility Overal bed mobility: Modified Independent Bed Mobility: Sit to Supine       Sit to supine: Supervision   General bed mobility comments: cues to use the Right leg to lift left  Transfers Overall transfer level: Needs assistance Equipment used: Rolling walker (2 wheeled) Transfers: Sit to/from Stand Sit to Stand: Supervision         General transfer comment: cues for UE/LE placement  Ambulation/Gait Ambulation/Gait assistance: Supervision Ambulation Distance (Feet): 40 Feet Assistive device: Rolling walker (2 wheeled) Gait Pattern/deviations: Step-to pattern;Step-through pattern     General Gait Details: cues for sequence and position inside the  RW   Stairs Stairs: Yes Stairs assistance: Min assist Stair Management: One rail Left;Step to pattern;Forwards;With cane Number of Stairs: 4 General stair comments: cues for sequence and safety with cane and rail  Wheelchair Mobility    Modified Rankin (Stroke Patients Only)       Balance                                    Cognition Arousal/Alertness: Awake/alert                          Exercises Total Joint Exercises Ankle Circles/Pumps: AROM;Both;10 reps;Supine Quad Sets: AROM;Both;10 reps;Supine Short Arc Quad: AROM;Left;10 reps;Supine Heel Slides: AROM;Left;10 reps;Supine Hip ABduction/ADduction: AROM;Left;10 reps;Supine    General Comments        Pertinent Vitals/Pain Pain Score: 1  Pain  Location: L thigh Pain Descriptors / Indicators: Tightness Pain Intervention(s): Monitored during session;Premedicated before session    Home Living                      Prior Function            PT Goals (current goals can now be found in the care plan section) Progress towards PT goals: Progressing toward goals    Frequency  7X/week    PT Plan Current plan remains appropriate    Co-evaluation             End of Session   Activity Tolerance: Patient tolerated treatment well Patient left: in bed     Time: 1424-1446 PT Time Calculation (min) (ACUTE ONLY): 22 min  Charges:  Gait 1                  G Codes:      Claretha Cooper 07/13/2015, 4:08 PM

## 2015-07-13 NOTE — Progress Notes (Signed)
Physical Therapy Treatment Patient Details Name: Mary Padilla MRN: [id number] DOB: [birth year] Today's Date: 07/13/2015    History of Present Illness [name]    PT Comments    The patient has min c/o soreness. Will practice steps then plans for DC to home.  Follow Up Recommendations  Home health PT;Supervision/Assistance - 24 hour     Equipment Recommendations  None recommended by PT    Recommendations for Other Services       Precautions / Restrictions Precautions Precautions: Fall Restrictions Weight Bearing Restrictions: No    Mobility  Bed Mobility Overal bed mobility: Needs Assistance Bed Mobility: Sit to Supine     Supine to sit: Min assist Sit to supine: Supervision   General bed mobility comments: cues to use the Right leg to lift left  Transfers Overall transfer level: Needs assistance Equipment used: Rolling walker (2 wheeled) Transfers: Sit to/from Stand Sit to Stand: Supervision         General transfer comment: cues for UE/LE placement  Ambulation/Gait Ambulation/Gait assistance: Supervision Ambulation Distance (Feet): 400 Feet Assistive device: Rolling walker (2 wheeled) Gait Pattern/deviations: Step-to pattern;Step-through pattern;Antalgic     General Gait Details: cues for sequence and position inside the  RW   Stairs            Wheelchair Mobility    Modified Rankin (Stroke Patients Only)       Balance                                    Cognition Arousal/Alertness: Awake/alert Behavior During Therapy: WFL for tasks assessed/performed Overall Cognitive Status: Within Functional Limits for tasks assessed                      Exercises Total Joint Exercises Ankle Circles/Pumps: AROM;Both;10 reps;Supine Quad Sets: AROM;Both;10 reps;Supine Short Arc Quad: AROM;Left;10 reps;Supine Heel Slides: AROM;Left;10 reps;Supine Hip ABduction/ADduction: AROM;Left;10 reps;Supine    General Comments         Pertinent Vitals/Pain Pain Assessment: 0-10 Pain Score: 1  Pain Location: L thigh Pain Descriptors / Indicators: Discomfort;Tender Pain Intervention(s): Limited activity within patient's tolerance;Monitored during session;Premedicated before session;Repositioned;Patient requesting pain meds-RN notified    Home Living Family/patient expects to be discharged to:: Private residence Living Arrangements: Spouse/significant other Available Help at Discharge: Family Type of Home: House       Home Equipment: Environmental consultant - 2 wheels;Bedside commode      Prior Function Level of Independence: Independent          PT Goals (current goals can now be found in the care plan section) Acute Rehab PT Goals Patient Stated Goal: home, back to being independent Progress towards PT goals: Progressing toward goals    Frequency  7X/week    PT Plan Current plan remains appropriate    Co-evaluation             End of Session   Activity Tolerance: Patient tolerated treatment well Patient left: in bed;with call bell/phone within reach     Time: 1101-1134 PT Time Calculation (min) (ACUTE ONLY): 33 min  Charges:  $Gait Training: 8-22 mins $Therapeutic Exercise: 8-22 mins                    G Codes:      Claretha Cooper 07/13/2015, 12:31 PM

## 2015-07-13 NOTE — Care Management Note (Addendum)
Case Management Note  Patient Details  Name: Mary Padilla MRN: [id number] Date of Birth: [birth year]  Subjective/Objective:                  LEFT TOTAL HIP ARTHROPLASTY ANTERIOR APPROACH (Left) Action/Plan: Discharge planning Expected Discharge Date:  07/13/15               Expected Discharge Plan:  [place]  In-House Referral:     Discharge planning Services  CM Consult  Post Acute Care Choice:    Choice offered to:  Patient  DME Arranged:  N/A DME Agency:  NA  HH Arranged:  PT HH Agency:  [facility]  Status of Service:  Completed, signed off  Medicare Important Message Given:    Date Medicare IM Given:    Medicare IM give by:    Date Additional Medicare IM Given:    Additional Medicare Important Message give by:     If discussed at [place] of Stay Meetings, dates discussed:    Additional Comments: CM met with pt in room to offer choice of home health agency.  Pt chooses [facility] to render HHPT.  Referral given to [employer], Tim.  Pt has both a rolling walker and 3n1 at home.  No other CM needs were communicated. Dellie Catholic, RN 07/13/2015, 10:54 AM

## 2015-07-13 NOTE — Evaluation (Signed)
Occupational Therapy Evaluation Patient Details Name: Mary Padilla MRN: [id number] DOB: 1970-04-18 Today's Date: 07/13/2015    History of Present Illness [name]   Clinical Impression   This 45 year old female was admitted for the above surgery. All education was completed.  Pt does not need any further OT at this time    Follow Up Recommendations  No OT follow up;Supervision/Assistance - 24 hour    Equipment Recommendations  None recommended by OT (pt will consider getting a shower seat herself)    Recommendations for Other Services       Precautions / Restrictions Precautions Precautions: Fall Restrictions Weight Bearing Restrictions: No      Mobility Bed Mobility         Supine to sit: Min assist     General bed mobility comments: assist for LLE and cues for sequence  Transfers   Equipment used: Rolling walker (2 wheeled) Transfers: Sit to/from Stand Sit to Stand: Min guard         General transfer comment: cues for UE/LE placement    Balance                                            ADL Overall ADL's : Needs assistance/impaired     Grooming: Wash/dry hands;Oral care;Supervision/safety;Standing   Upper Body Bathing: Set up;Sitting   Lower Body Bathing: Minimal assistance;Sit to/from stand   Upper Body Dressing : Set up;Sitting       Toilet Transfer: Min guard;RW;Comfort height toilet;Ambulation   Toileting- Clothing Manipulation and Hygiene: Min guard;Sit to/from stand   Tub/ Shower Transfer: Walk-in shower;Min guard;Ambulation     General ADL Comments: educated on AE--pt has a reacher at home and will have assistance for adls as needed. She does not have a shower seat, but will consider getting one herself. She doesn't want a 3:1 and can get up from comfort height commode with only walker. Educated on sidestepping through tight Garment/textile technologist      Pertinent Vitals/Pain Pain  Assessment: 0-10 Pain Score: 2  Pain Location: l hip Pain Descriptors / Indicators: Heaviness Pain Intervention(s): Limited activity within patient's tolerance;Monitored during session;Premedicated before session;Repositioned;Ice applied     Hand Dominance     Extremity/Trunk Assessment Upper Extremity Assessment Upper Extremity Assessment: Overall WFL for tasks assessed           Communication Communication Communication: No difficulties   Cognition Arousal/Alertness: Awake/alert Behavior During Therapy: WFL for tasks assessed/performed Overall Cognitive Status: Within Functional Limits for tasks assessed                     General Comments       Exercises       Shoulder Instructions      Home Living Family/patient expects to be discharged to:: Private residence Living Arrangements: Spouse/significant other Available Help at Discharge: Family Type of Home: House             Bathroom Shower/Tub: Walk-in Corporate treasurer Toilet: Standard     Home Equipment: Environmental consultant - 2 wheels;Bedside commode          Prior Functioning/Environment Level of Independence: Independent             OT Diagnosis: Acute pain   OT Problem List:  OT Treatment/Interventions:      OT Goals(Current goals can be found in the care plan section) Acute Rehab OT Goals Patient Stated Goal: home, back to being independent OT Goal Formulation: All assessment and education complete, DC therapy  OT Frequency:     Barriers to D/C:            Co-evaluation              End of Session    Activity Tolerance: Patient tolerated treatment well;No increased pain Patient left: in chair;with call bell/phone within reach;with chair alarm set   Time: [phone] OT Time Calculation (min): 30 min Charges:  OT General Charges $OT Visit: 1 Procedure OT Evaluation $OT Eval Low Complexity: 1 Procedure OT Treatments $Self Care/Home Management : 8-22 mins G-Codes:     [address] Aug 07, 2015, 9:35 AM  Lesle Chris, OTR/L [phone] 08-07-15

## 2015-07-13 NOTE — Progress Notes (Signed)
     Subjective: 1 Day Post-Op Procedure(s) (LRB): LEFT TOTAL HIP ARTHROPLASTY ANTERIOR APPROACH (Left)   Patient reports pain as mild, pain controlled. No events throughout the night.  Ready to be discharged home, if she does well with PT.  Objective:   VITALS:   Filed Vitals:   07/13/15 0100 07/13/15 0600  BP: 123/66 128/76  Pulse: 63 69  Temp: 98.1 F (36.7 C) 97.8 F (36.6 C)  Resp: 16 16    Dorsiflexion/Plantar flexion intact Incision: dressing C/D/I No cellulitis present Compartment soft  LABS  Recent Labs  07/13/15 0421  HGB 11.9*  HCT 35.4*  WBC 12.9*  PLT 221     Recent Labs  07/13/15 0421  NA 137  K 4.4  BUN 10  CREATININE 0.79  GLUCOSE 141*     Assessment/Plan: 1 Day Post-Op Procedure(s) (LRB): LEFT TOTAL HIP ARTHROPLASTY ANTERIOR APPROACH (Left) Foley cath d/c'ed Advance diet Up with therapy D/C IV fluids Discharge home with home health  Follow up in 2 weeks at [facility]. Follow up with OLIN,Deion Forgue D in 2 weeks.  Contact information:  [facility] [address] [place] [id number]    Obese (BMI 30-39.9) Estimated body mass index is 39.4 kg/(m^2) as calculated from the following:   Height as of this encounter: 5\' 6"  (1.676 m).   Weight as of this encounter: 110.678 kg (244 lb). Patient also counseled that weight may inhibit the healing process Patient counseled that losing weight will help with future health issues        [place] Pugh. Brendon Christoffel   PAC  07/13/2015, 8:33 AM

## 2015-07-13 NOTE — Discharge Instructions (Signed)

## 2015-07-19 NOTE — Discharge Summary (Signed)
Physician Discharge Summary  Patient ID: Mary Padilla MRN: [id number] DOB/AGE: 10/19/1970 45 y.o.  Admit date: 07/12/2015 Discharge date: 07/13/2015   Procedures:  Procedure(s) (LRB): LEFT TOTAL HIP ARTHROPLASTY ANTERIOR APPROACH (Left)  Attending Physician:  Dr. Paralee Cancel   Admission Diagnoses:   Left hip primary OA / pain  Discharge Diagnoses:  Principal Problem:   S/P left THA, AA Active Problems:   Obese  Past Medical History  Diagnosis Date  . Complication of anesthesia     difficult to awaken   . Family history of adverse reaction to anesthesia     pts mother has allergy to sodium pentathol  . Wears glasses   . History of bronchitis   . Anxiety   . Arthritis   . Cancer [facility])     melanoma right breast     HPI:    Mary Padilla, 45 y.o. female, has a history of pain and functional disability in the left hip(s) due to arthritis and patient has failed non-surgical conservative treatments for greater than 12 weeks to include NSAID's and/or analgesics, corticosteriod injections and activity modification. Onset of symptoms was gradual starting 2+ years ago with gradually worsening course since that time.The patient noted no past surgery on the left hip(s). Patient currently rates pain in the left hip at 10 out of 10 with activity. Patient has night pain, worsening of pain with activity and weight bearing, trendelenberg gait, pain that interfers with activities of daily living and pain with passive range of motion. Patient has evidence of periarticular osteophytes and joint space narrowing by imaging studies. This condition presents safety issues increasing the risk of falls. There is no current active infection. Risks, benefits and expectations were discussed with the patient. Risks including but not limited to the risk of anesthesia, blood clots, nerve damage, blood vessel damage, failure of the prosthesis, infection and up to and including death. Patient understand the  risks, benefits and expectations and wishes to proceed with surgery.   PCP: Mary Flow, MD   Discharged Condition: good  Hospital Course:  Patient underwent the above stated procedure on 07/12/2015. Patient tolerated the procedure well and brought to the recovery room in good condition and subsequently to the floor.  POD #1 BP: 128/76 ; Pulse: 69 ; Temp: 97.8 F (36.6 C) ; Resp: 16 Patient reports pain as mild, pain controlled. No events throughout the night. Ready to be discharged home. Dorsiflexion/plantar flexion intact, incision: dressing C/D/I, no cellulitis present and compartment soft.   LABS  Basename    HGB     11.9  HCT     35.4    Discharge Exam: General appearance: alert, cooperative and no distress Extremities: Homans sign is negative, no sign of DVT, no edema, redness or tenderness in the calves or thighs and no ulcers, gangrene or trophic changes  Disposition: Home with follow up in 2 weeks   Follow-up Information    Follow up with Mauri Pole, MD. Schedule an appointment as soon as possible for a visit in 2 weeks.   Specialty:  Orthopedic Surgery   Contact information:   [address] [place] [address] [phone]       Follow up with [facility].   Why:  home health physical therapy   Contact information:   [address] [place] [address] [phone]       Follow up with [facility].   Contact information:   [place]  [address] [place] [address] [phone]       Discharge Instructions    Call MD / Call 911    Complete by:  As directed   If you experience chest pain or shortness of breath, CALL 911 and be transported to the hospital emergency room.  If you develope a fever above 101 F, pus (white drainage) or increased drainage or redness at the wound, or calf pain, call your surgeon's office.     Change dressing    Complete by:  As directed   Maintain surgical dressing  until follow up in the clinic. If the edges start to pull up, may reinforce with tape. If the dressing is no longer working, may remove and cover with gauze and tape, but must keep the area dry and clean.  Call with any questions or concerns.     Constipation Prevention    Complete by:  As directed   Drink plenty of fluids.  Prune juice may be helpful.  You may use a stool softener, such as Colace (over the counter) 100 mg twice a day.  Use MiraLax (over the counter) for constipation as needed.     Diet - low sodium heart healthy    Complete by:  As directed      Discharge instructions    Complete by:  As directed   Maintain surgical dressing until follow up in the clinic. If the edges start to pull up, may reinforce with tape. If the dressing is no longer working, may remove and cover with gauze and tape, but must keep the area dry and clean.  Follow up in 2 weeks at [facility]. Call with any questions or concerns.     Increase activity slowly as tolerated    Complete by:  As directed   Weight bearing as tolerated with assist device (walker, cane, etc) as directed, use it as long as suggested by your surgeon or therapist, typically at least 4-6 weeks.     TED hose    Complete by:  As directed   Use stockings (TED hose) for 2 weeks on both leg(s).  You may remove them at night for sleeping.             Medication List    STOP taking these medications        acetaminophen 500 MG tablet  Commonly known as:  TYLENOL     meloxicam 7.5 MG tablet  Commonly known as:  MOBIC     traMADol 50 MG tablet  Commonly known as:  ULTRAM      TAKE these medications        aspirin 325 MG EC tablet  Take 1 tablet (325 mg total) by mouth 2 (two) times daily.     docusate sodium 100 MG capsule  Commonly known as:  COLACE  Take 1 capsule (100 mg total) by mouth 2 (two) times daily.     ferrous sulfate 325 (65 FE) MG tablet  Take 1 tablet (325 mg total) by mouth 3 (three) times  daily after meals.     HYDROcodone-acetaminophen 7.5-325 MG tablet  Commonly known as:  NORCO  Take 1-2 tablets by mouth every 4 (four) hours as needed for moderate pain.     methocarbamol 500 MG tablet  Commonly known as:  ROBAXIN  Take 1 tablet (500 mg total) by mouth every 6 (six) hours as needed for muscle spasms.     polyethylene glycol packet  Commonly known as:  MIRALAX / GLYCOLAX  Take 17 g by mouth 2 (two) times daily.     PREMARIN 0.9 MG tablet  Generic drug:  estrogens (conjugated)  Take 0.9 mg by mouth daily.     venlafaxine XR 150 MG 24 hr capsule  Commonly known as:  EFFEXOR-XR  Take 150 mg by mouth every morning.         Signed: West Pugh. Angelmarie Ponzo   PA-C  07/19/2015, 2:51 PM

## 2017-01-05 IMAGING — DX DG HIP (WITH OR WITHOUT PELVIS) 1V PORT*L*
2 series · 2 of 2 positions shown · non-contrast
Comparison: None.

CLINICAL DATA: Status post total hip replaced

EXAM:
DG C-ARM 1-60 MIN-NO REPORT; DG HIP (WITH OR WITHOUT PELVIS) 2V PORT
LEFT

[hip frog leg]
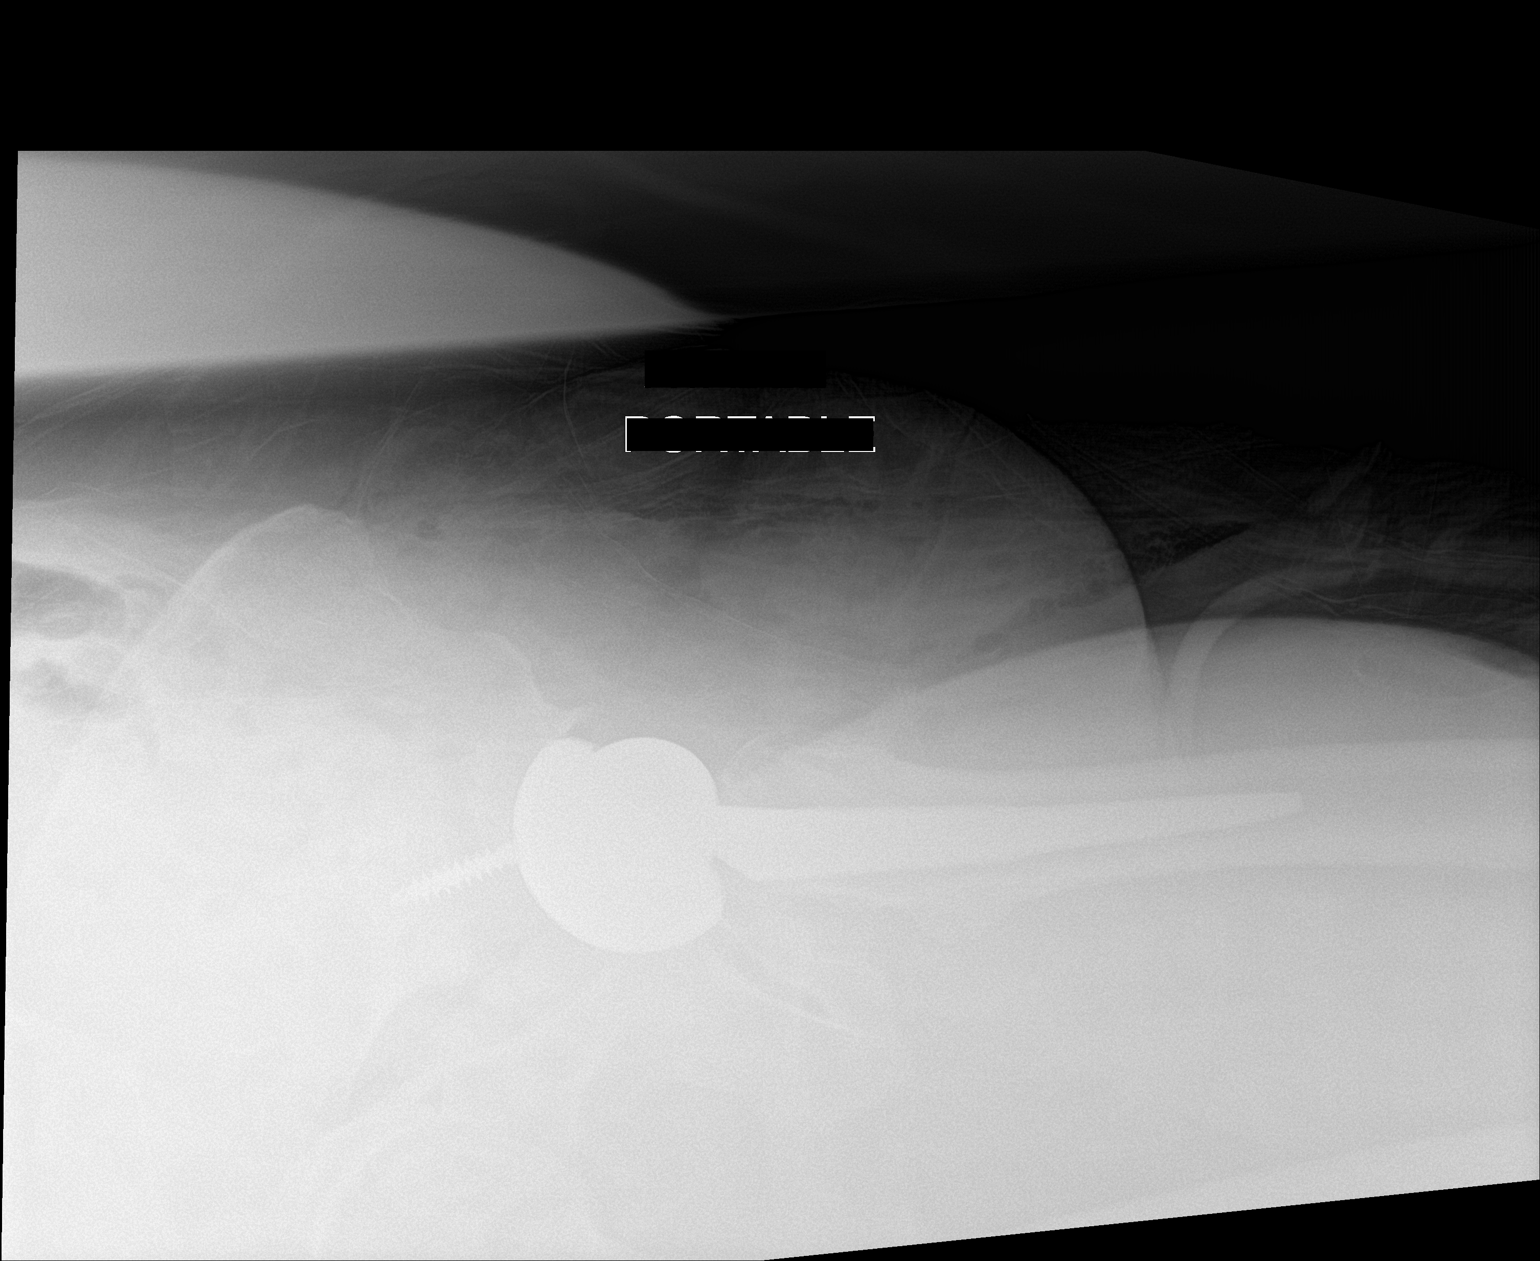

[pelvis ap]
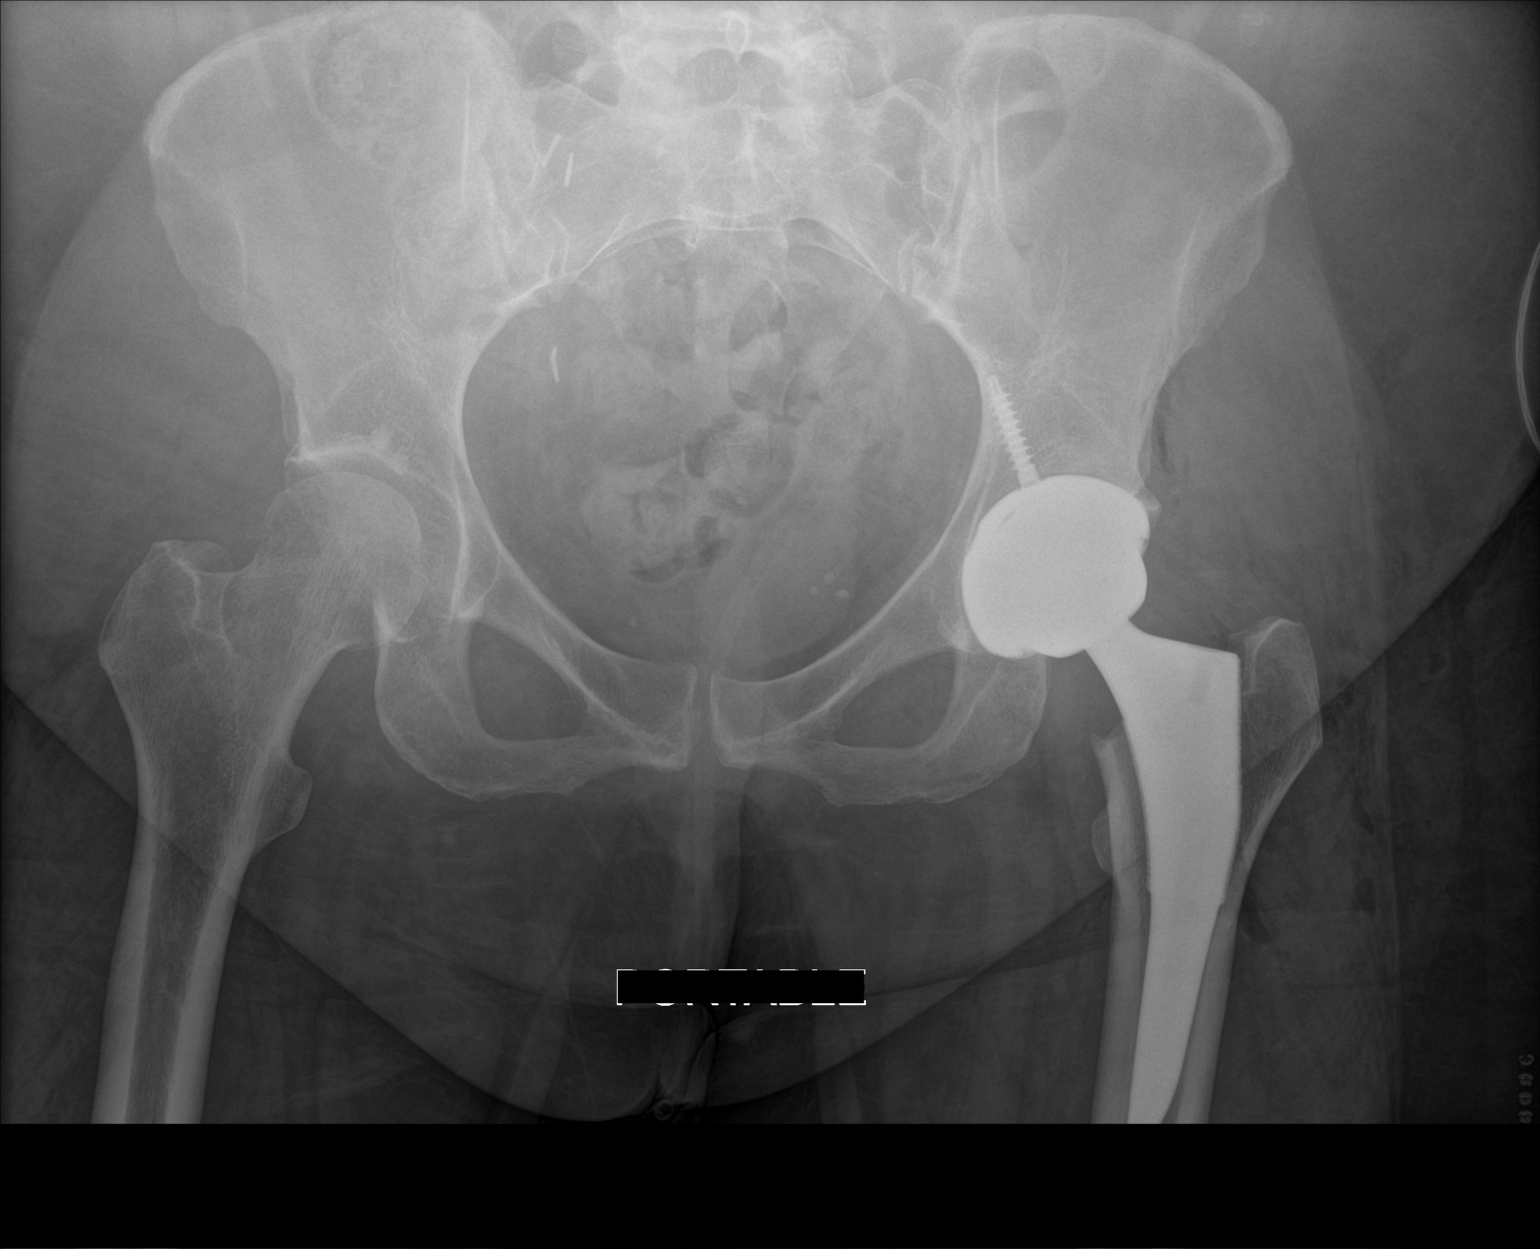

[2 of 2 positions shown; findings below may reference images not displayed]

FINDINGS: Frontal pelvis and lateral left hip images were obtained. There is a
total hip replacement on the left with prosthetic components on the
left appearing well-seated. Soft tissue air on the left is an
expected postoperative finding. No acute fracture or dislocation.
Right hip joint appears unremarkable. There are surgical clips in
the right pelvis.
IMPRESSION: Total hip replacement on the left with prosthetic components
appearing well-seated. No acute fracture or dislocation. Right hip
joint appears unremarkable.

## 2021-12-09 ENCOUNTER — Other Ambulatory Visit (HOSPITAL_COMMUNITY): Payer: Self-pay

## 2021-12-09 MED ORDER — WEGOVY 0.25 MG/0.5ML ~~LOC~~ SOAJ
0.2500 mg | SUBCUTANEOUS | 0 refills | Status: DC
  Filled 2021-12-09: qty 2, 28d supply, fill #0

## 2022-01-06 ENCOUNTER — Other Ambulatory Visit (HOSPITAL_COMMUNITY): Payer: Self-pay

## 2022-01-06 MED ORDER — WEGOVY 0.5 MG/0.5ML ~~LOC~~ SOAJ
0.5000 mL | SUBCUTANEOUS | 0 refills | Status: AC
  Filled 2022-01-06: qty 2, 28d supply, fill #0

## 2022-02-08 ENCOUNTER — Other Ambulatory Visit (HOSPITAL_COMMUNITY): Payer: Self-pay

## 2022-02-08 MED ORDER — WEGOVY 0.25 MG/0.5ML ~~LOC~~ SOAJ
0.2500 mg | SUBCUTANEOUS | 0 refills | Status: AC
  Filled 2022-02-08 – 2022-02-19 (×2): qty 2, 28d supply, fill #0

## 2022-02-19 ENCOUNTER — Other Ambulatory Visit (HOSPITAL_COMMUNITY): Payer: Self-pay

## 2022-02-21 ENCOUNTER — Other Ambulatory Visit (HOSPITAL_COMMUNITY): Payer: Self-pay

## 2022-02-21 ENCOUNTER — Other Ambulatory Visit (HOSPITAL_BASED_OUTPATIENT_CLINIC_OR_DEPARTMENT_OTHER): Payer: Self-pay

## 2022-02-21 ENCOUNTER — Other Ambulatory Visit: Payer: Self-pay

## 2024-02-18 ENCOUNTER — Other Ambulatory Visit (HOSPITAL_BASED_OUTPATIENT_CLINIC_OR_DEPARTMENT_OTHER): Payer: Self-pay | Admitting: Family Medicine

## 2024-02-18 DIAGNOSIS — E7800 Pure hypercholesterolemia, unspecified: Secondary | ICD-10-CM

## 2024-02-21 ENCOUNTER — Ambulatory Visit (HOSPITAL_BASED_OUTPATIENT_CLINIC_OR_DEPARTMENT_OTHER)
Admission: RE | Admit: 2024-02-21 | Discharge: 2024-02-21 | Disposition: A | Discharge status: Home / Self Care | Payer: Self-pay | Source: Ambulatory Visit | Attending: Family Medicine | Admitting: Family Medicine

## 2024-02-21 DIAGNOSIS — E7800 Pure hypercholesterolemia, unspecified: Secondary | ICD-10-CM
